# Patient Record
Sex: Male | Born: 2000 | Hispanic: No | Marital: Single
Health system: Southern US, Community
[De-identification: ages and names within clinical notes are randomized; demographics above are authoritative.]

---
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
## 2021-09-20 IMAGING — CT CT ABD-PELV W/ CM
2 of 4 series · 16 of 46 positions shown, 18 images · IV contrast (Omni 300)
Comparison: None.

CLINICAL DATA: Left lower quadrant pain for 3 days, initial
encounter

EXAM:
CT ABDOMEN AND PELVIS WITH CONTRAST
TECHNIQUE: Multidetector CT imaging of the abdomen and pelvis was performed
using the standard protocol following bolus administration of
intravenous contrast.
CONTRAST:  80mL OMNIPAQUE IOHEXOL 300 MG/ML  SOLN

[Series 3: a/p w/ 5mm · axial · 0.64mm/px · z∈[-558,-188]mm · 13 of 82 slices shown, 15 images]
[im 4/82  soft-tissue]
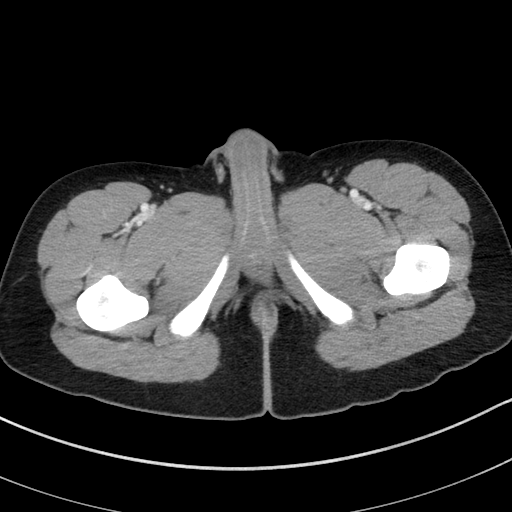
[im 4/82  bone]
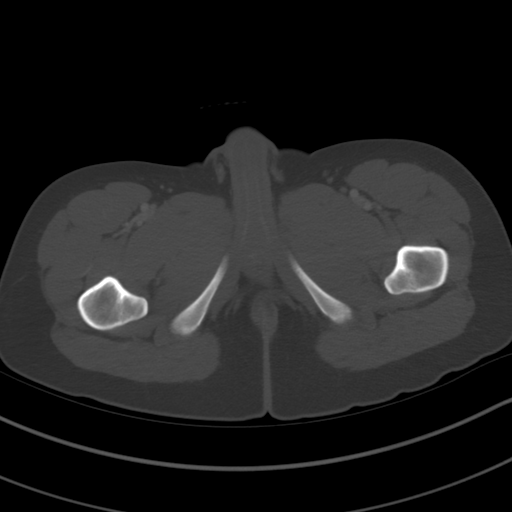
[im 11/82  soft-tissue]
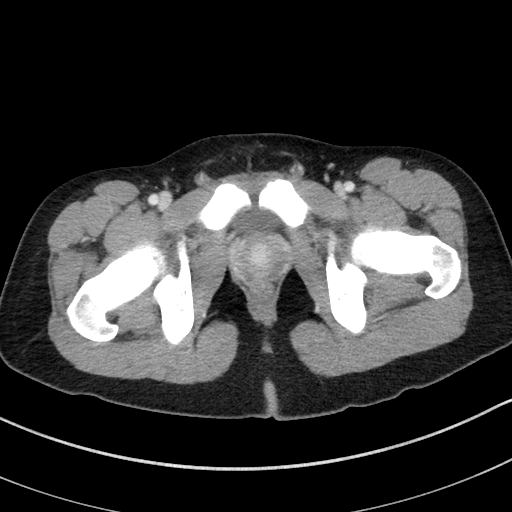
[im 17/82  soft-tissue]
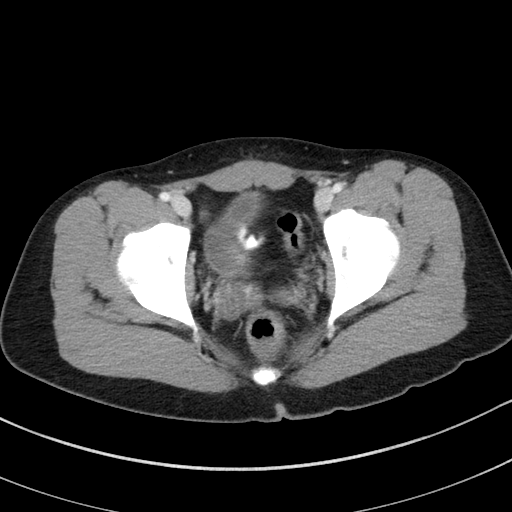
[im 24/82  soft-tissue]
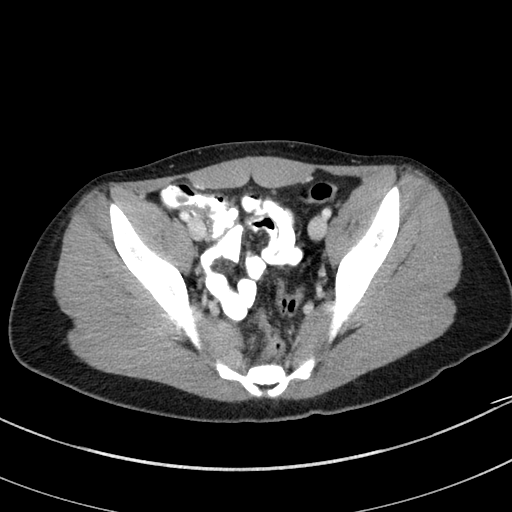
[im 28/82  soft-tissue]
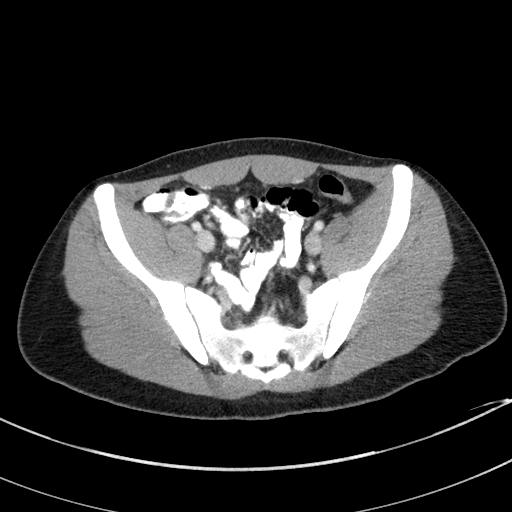
[im 34/82  soft-tissue]
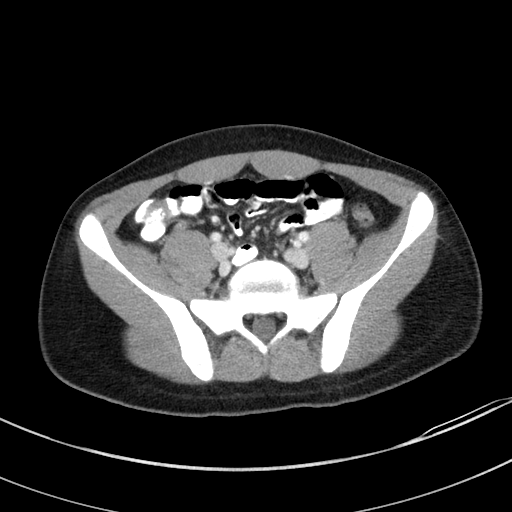
[im 41/82  soft-tissue]
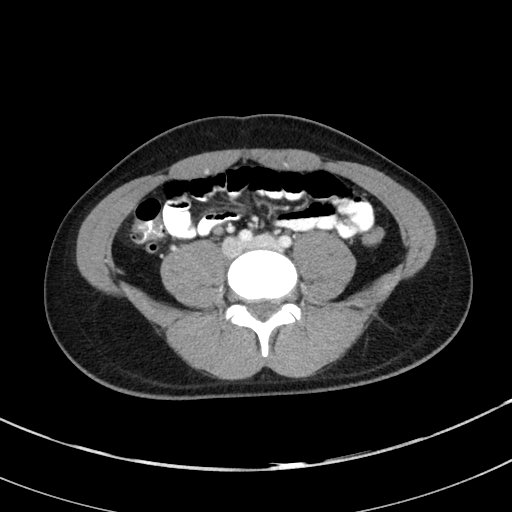
[im 48/82  soft-tissue]
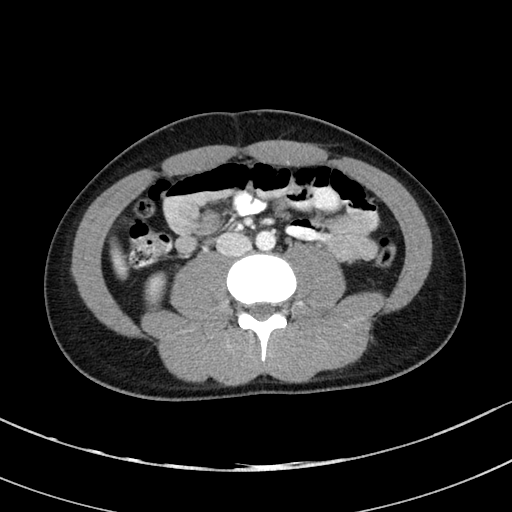
[im 55/82  soft-tissue]
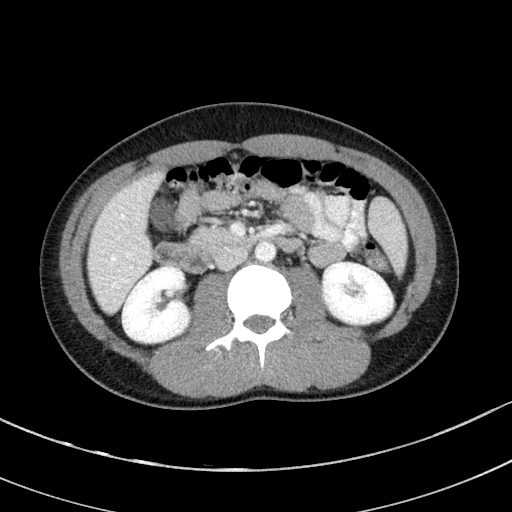
[im 55/82  bone]
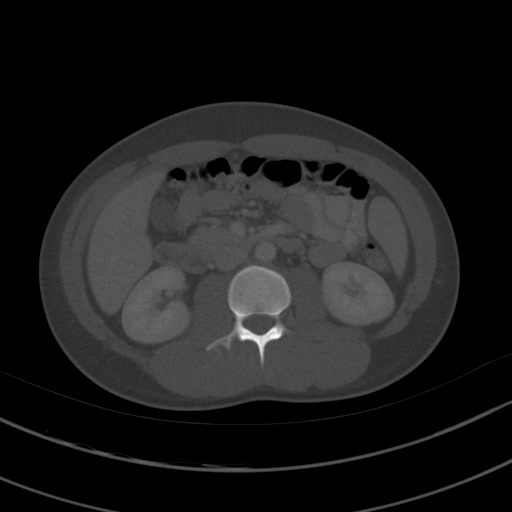
[im 58/82  soft-tissue]
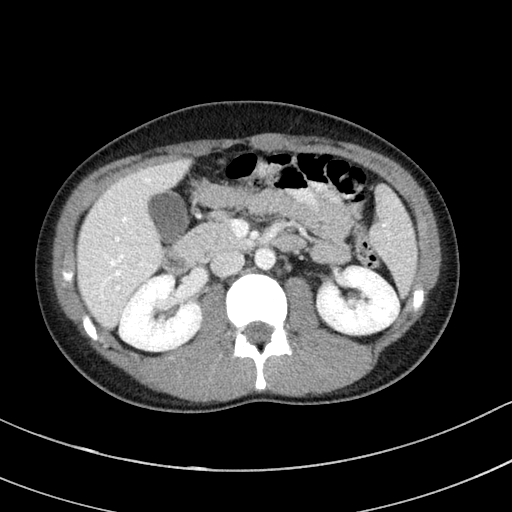
[im 65/82  soft-tissue]
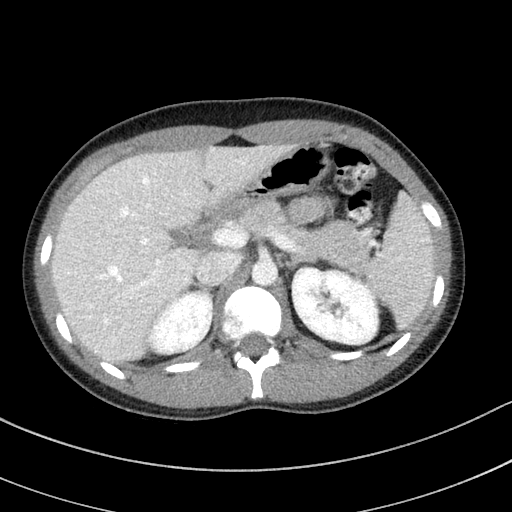
[im 71/82  soft-tissue]
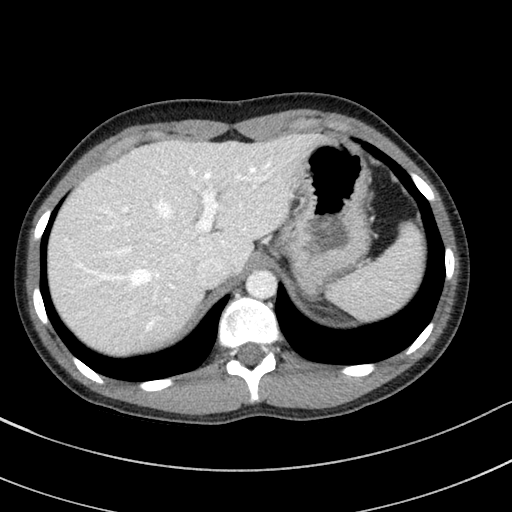
[im 78/82  soft-tissue]
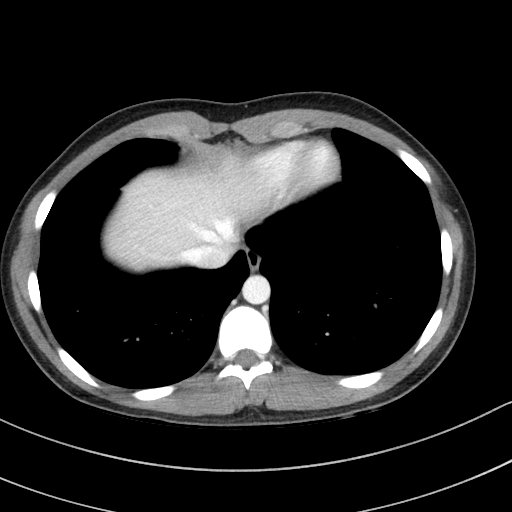

[Series 6: a/p w/ cor · coronal · 0.67mm/px · 3 of 108 slices shown]
[im 36/108  soft-tissue]
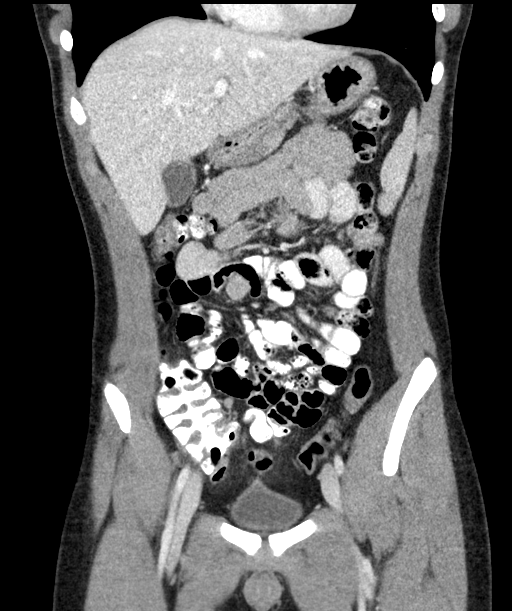
[im 48/108  soft-tissue]
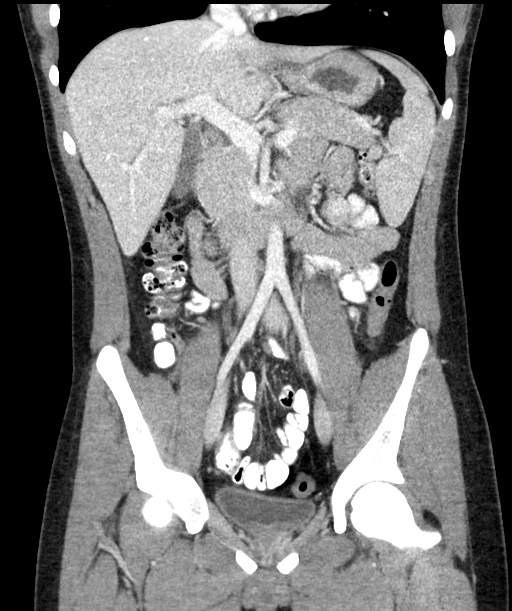
[im 60/108  soft-tissue]
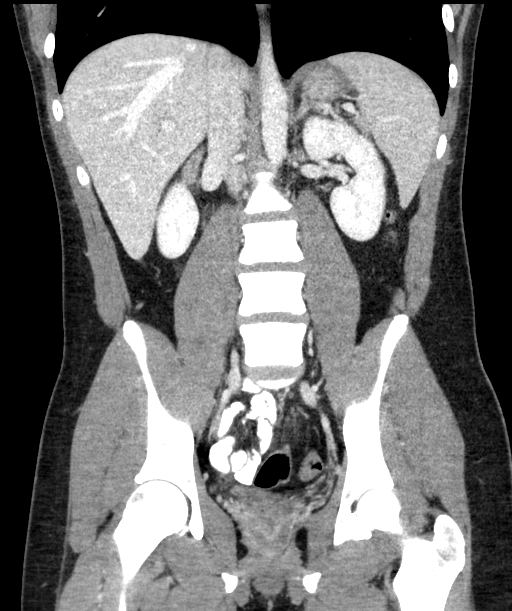

[16 of 46 positions shown; findings below may reference images not displayed]

FINDINGS: Lower chest: No acute abnormality.

Hepatobiliary: No focal liver abnormality is seen. No gallstones,
gallbladder wall thickening, or biliary dilatation.

Pancreas: Unremarkable. No pancreatic ductal dilatation or
surrounding inflammatory changes.

Spleen: Normal in size without focal abnormality.

Adrenals/Urinary Tract: Adrenal glands are within normal limits.
Kidneys demonstrate a normal enhancement pattern bilaterally. No
renal calculi or obstructive changes are seen. Bladder is
decompressed.

Stomach/Bowel: Colon is predominately decompressed. The appendix is
retrocecal in position and filled with air. No periappendiceal
inflammatory changes to suggest appendicitis are noted at this time.
Small bowel and stomach appear within normal limits.

Vascular/Lymphatic: No significant vascular findings are present. No
enlarged abdominal or pelvic lymph nodes.

Reproductive: Prostate is unremarkable.

Other: No abdominal wall hernia or abnormality. No abdominopelvic
ascites.

Musculoskeletal: No acute or significant osseous findings.
IMPRESSION: Normal-appearing appendix.

Previously seen abnormality within the left colon on ultrasound is
related to the decompressed colon. No inflammatory changes are seen.

No other focal abnormality is noted.
